# Patient Record
Sex: Female | Born: 1985 | Race: White | Hispanic: No | Marital: Married | Smoking: Current some day smoker
Health system: Southern US, Community
[De-identification: ages and names within clinical notes are randomized; demographics above are authoritative.]

## PROBLEM LIST (undated history)

## (undated) DIAGNOSIS — F4000 Agoraphobia, unspecified: Secondary | ICD-10-CM

## (undated) DIAGNOSIS — F419 Anxiety disorder, unspecified: Secondary | ICD-10-CM

## (undated) DIAGNOSIS — F4310 Post-traumatic stress disorder, unspecified: Secondary | ICD-10-CM

---

## 2017-09-08 ENCOUNTER — Emergency Department (HOSPITAL_COMMUNITY): Payer: BLUE CROSS/BLUE SHIELD

## 2017-09-08 ENCOUNTER — Inpatient Hospital Stay (HOSPITAL_COMMUNITY)
Admission: EM | Admit: 2017-09-08 | Discharge: 2017-09-15 | DRG: 101 | Disposition: A | Discharge status: Home / Self Care | Payer: BLUE CROSS/BLUE SHIELD | Attending: Internal Medicine | Admitting: Internal Medicine

## 2017-09-08 ENCOUNTER — Other Ambulatory Visit: Payer: Self-pay

## 2017-09-08 ENCOUNTER — Encounter (HOSPITAL_COMMUNITY): Payer: Self-pay | Admitting: Emergency Medicine

## 2017-09-08 DIAGNOSIS — F10239 Alcohol dependence with withdrawal, unspecified: Secondary | ICD-10-CM | POA: Diagnosis not present

## 2017-09-08 DIAGNOSIS — D7589 Other specified diseases of blood and blood-forming organs: Secondary | ICD-10-CM | POA: Diagnosis present

## 2017-09-08 DIAGNOSIS — R569 Unspecified convulsions: Secondary | ICD-10-CM | POA: Diagnosis not present

## 2017-09-08 DIAGNOSIS — F329 Major depressive disorder, single episode, unspecified: Secondary | ICD-10-CM | POA: Diagnosis present

## 2017-09-08 DIAGNOSIS — F13239 Sedative, hypnotic or anxiolytic dependence with withdrawal, unspecified: Secondary | ICD-10-CM | POA: Diagnosis present

## 2017-09-08 DIAGNOSIS — S12601D Unspecified nondisplaced fracture of seventh cervical vertebra, subsequent encounter for fracture with routine healing: Secondary | ICD-10-CM | POA: Diagnosis not present

## 2017-09-08 DIAGNOSIS — F10231 Alcohol dependence with withdrawal delirium: Secondary | ICD-10-CM | POA: Diagnosis present

## 2017-09-08 DIAGNOSIS — R45851 Suicidal ideations: Secondary | ICD-10-CM | POA: Diagnosis present

## 2017-09-08 DIAGNOSIS — F4310 Post-traumatic stress disorder, unspecified: Secondary | ICD-10-CM | POA: Diagnosis present

## 2017-09-08 DIAGNOSIS — S12600A Unspecified displaced fracture of seventh cervical vertebra, initial encounter for closed fracture: Secondary | ICD-10-CM

## 2017-09-08 DIAGNOSIS — R441 Visual hallucinations: Secondary | ICD-10-CM | POA: Diagnosis present

## 2017-09-08 DIAGNOSIS — F17200 Nicotine dependence, unspecified, uncomplicated: Secondary | ICD-10-CM | POA: Diagnosis present

## 2017-09-08 DIAGNOSIS — F445 Conversion disorder with seizures or convulsions: Secondary | ICD-10-CM | POA: Diagnosis present

## 2017-09-08 DIAGNOSIS — R718 Other abnormality of red blood cells: Secondary | ICD-10-CM | POA: Diagnosis present

## 2017-09-08 DIAGNOSIS — F1010 Alcohol abuse, uncomplicated: Secondary | ICD-10-CM | POA: Diagnosis not present

## 2017-09-08 DIAGNOSIS — G4089 Other seizures: Secondary | ICD-10-CM | POA: Diagnosis present

## 2017-09-08 DIAGNOSIS — F515 Nightmare disorder: Secondary | ICD-10-CM | POA: Diagnosis not present

## 2017-09-08 DIAGNOSIS — G40909 Epilepsy, unspecified, not intractable, without status epilepticus: Secondary | ICD-10-CM | POA: Diagnosis present

## 2017-09-08 DIAGNOSIS — S129XXA Fracture of neck, unspecified, initial encounter: Secondary | ICD-10-CM

## 2017-09-08 HISTORY — DX: Anxiety disorder, unspecified: F41.9

## 2017-09-08 HISTORY — DX: Agoraphobia, unspecified: F40.00

## 2017-09-08 HISTORY — DX: Post-traumatic stress disorder, unspecified: F43.10

## 2017-09-08 LAB — CBC WITH DIFFERENTIAL/PLATELET
BASOS ABS: 0 10*3/uL (ref 0.0–0.1)
BASOS PCT: 0 %
Eosinophils Absolute: 0.1 10*3/uL (ref 0.0–0.7)
Eosinophils Relative: 2 %
HEMATOCRIT: 38.8 % (ref 36.0–46.0)
Hemoglobin: 12.7 g/dL (ref 12.0–15.0)
LYMPHS ABS: 1.2 10*3/uL (ref 0.7–4.0)
LYMPHS PCT: 16 %
MCH: 32.5 pg (ref 26.0–34.0)
MCHC: 32.7 g/dL (ref 30.0–36.0)
MCV: 99.2 fL (ref 78.0–100.0)
MONOS PCT: 4 %
Monocytes Absolute: 0.3 10*3/uL (ref 0.1–1.0)
Neutro Abs: 5.6 10*3/uL (ref 1.7–7.7)
Neutrophils Relative %: 78 %
Platelets: 192 10*3/uL (ref 150–400)
RBC: 3.91 MIL/uL (ref 3.87–5.11)
RDW: 12.8 % (ref 11.5–15.5)
WBC: 7.2 10*3/uL (ref 4.0–10.5)

## 2017-09-08 LAB — COMPREHENSIVE METABOLIC PANEL
ALBUMIN: 3.4 g/dL — AB (ref 3.5–5.0)
ALK PHOS: 48 U/L (ref 38–126)
ALT: 39 U/L (ref 14–54)
AST: 35 U/L (ref 15–41)
Anion gap: 6 (ref 5–15)
BILIRUBIN TOTAL: 0.4 mg/dL (ref 0.3–1.2)
BUN: 16 mg/dL (ref 6–20)
CALCIUM: 8.6 mg/dL — AB (ref 8.9–10.3)
CO2: 26 mmol/L (ref 22–32)
Chloride: 106 mmol/L (ref 101–111)
Creatinine, Ser: 0.63 mg/dL (ref 0.44–1.00)
GFR calc Af Amer: >60 mL/min (ref >60)
GFR calc non Af Amer: >60 mL/min (ref >60)
GLUCOSE: 90 mg/dL (ref 65–99)
Potassium: 3.9 mmol/L (ref 3.5–5.1)
Sodium: 138 mmol/L (ref 135–145)
TOTAL PROTEIN: 6.4 g/dL — AB (ref 6.5–8.1)

## 2017-09-08 LAB — I-STAT BETA HCG BLOOD, ED (MC, WL, AP ONLY): I-stat hCG, quantitative: <5 m[IU]/mL (ref <5)

## 2017-09-08 LAB — CBG MONITORING, ED: Glucose-Capillary: 108 mg/dL — ABNORMAL HIGH (ref 65–99)

## 2017-09-08 LAB — ETHANOL: Alcohol, Ethyl (B): <10 mg/dL (ref <10)

## 2017-09-08 MED ORDER — TRAMADOL HCL 50 MG PO TABS: 50.0000 mg | ORAL_TABLET | Freq: Four times a day (QID) | ORAL | Status: DC | PRN

## 2017-09-08 MED ORDER — LORAZEPAM 1 MG PO TABS: 1.0000 mg | ORAL_TABLET | Freq: Four times a day (QID) | ORAL | Status: DC | PRN

## 2017-09-08 MED ORDER — CARBAMAZEPINE 200 MG PO TABS
200.0000 mg | ORAL_TABLET | Freq: Two times a day (BID) | ORAL | Status: DC
  Administered 2017-09-08 – 2017-09-11 (×7): 200 mg via ORAL
  Filled 2017-09-08 (×8): qty 1

## 2017-09-08 MED ORDER — CLONAZEPAM 1 MG PO TABS
1.0000 mg | ORAL_TABLET | Freq: Two times a day (BID) | ORAL | Status: DC
  Administered 2017-09-08 – 2017-09-09 (×2): 1 mg via ORAL
  Filled 2017-09-08 (×2): qty 1

## 2017-09-08 MED ORDER — PAROXETINE HCL 20 MG PO TABS
20.0000 mg | ORAL_TABLET | Freq: Every day | ORAL | Status: DC
Start: 2017-09-09 — End: 2017-09-12
  Administered 2017-09-09 – 2017-09-11 (×3): 20 mg via ORAL
  Filled 2017-09-08 (×3): qty 1

## 2017-09-08 MED ORDER — ACETAMINOPHEN 325 MG PO TABS: 650.0000 mg | ORAL_TABLET | Freq: Four times a day (QID) | ORAL | Status: DC | PRN

## 2017-09-08 MED ORDER — IBUPROFEN 800 MG PO TABS
800.0000 mg | ORAL_TABLET | Freq: Once | ORAL | Status: AC
  Administered 2017-09-08: 800 mg via ORAL
  Filled 2017-09-08: qty 1

## 2017-09-08 MED ORDER — LORAZEPAM 2 MG/ML IJ SOLN
1.0000 mg | Freq: Four times a day (QID) | INTRAMUSCULAR | Status: DC | PRN
  Administered 2017-09-08 – 2017-09-09 (×3): 1 mg via INTRAVENOUS
  Filled 2017-09-08 (×3): qty 1

## 2017-09-08 MED ORDER — THIAMINE HCL 100 MG/ML IJ SOLN
Freq: Once | INTRAVENOUS | Status: AC
  Administered 2017-09-08: 17:00:00 via INTRAVENOUS
  Filled 2017-09-08 (×2): qty 1000

## 2017-09-08 MED ORDER — ACETAMINOPHEN 650 MG RE SUPP: 650.0000 mg | Freq: Four times a day (QID) | RECTAL | Status: DC | PRN

## 2017-09-08 MED ORDER — HYDROCODONE-ACETAMINOPHEN 5-325 MG PO TABS
1.0000 | ORAL_TABLET | Freq: Four times a day (QID) | ORAL | Status: DC | PRN
  Administered 2017-09-08 – 2017-09-09 (×4): 1 via ORAL
  Filled 2017-09-08 (×4): qty 1

## 2017-09-08 MED ORDER — ZOLPIDEM TARTRATE 5 MG PO TABS
5.0000 mg | ORAL_TABLET | Freq: Once | ORAL | Status: AC
  Administered 2017-09-09: 5 mg via ORAL
  Filled 2017-09-08: qty 1

## 2017-09-08 MED ORDER — IBUPROFEN 800 MG PO TABS
800.0000 mg | ORAL_TABLET | Freq: Four times a day (QID) | ORAL | Status: DC | PRN
  Administered 2017-09-09: 800 mg via ORAL
  Filled 2017-09-08: qty 1

## 2017-09-08 MED ORDER — ONDANSETRON HCL 4 MG PO TABS: 4.0000 mg | ORAL_TABLET | Freq: Four times a day (QID) | ORAL | Status: DC | PRN

## 2017-09-08 MED ORDER — MORPHINE SULFATE (PF) 2 MG/ML IV SOLN
2.0000 mg | Freq: Once | INTRAVENOUS | Status: AC
  Administered 2017-09-08: 2 mg via INTRAVENOUS
  Filled 2017-09-08: qty 1

## 2017-09-08 MED ORDER — ENOXAPARIN SODIUM 40 MG/0.4ML ~~LOC~~ SOLN
40.0000 mg | SUBCUTANEOUS | Status: DC
  Administered 2017-09-09 – 2017-09-13 (×5): 40 mg via SUBCUTANEOUS
  Filled 2017-09-08 (×7): qty 0.4

## 2017-09-08 MED ORDER — ONDANSETRON HCL 4 MG/2ML IJ SOLN
4.0000 mg | Freq: Four times a day (QID) | INTRAMUSCULAR | Status: DC | PRN
  Administered 2017-09-11 – 2017-09-13 (×4): 4 mg via INTRAVENOUS
  Filled 2017-09-08 (×5): qty 2

## 2017-09-08 NOTE — ED Notes (Signed)
URINE SAMPLE AT BEDSIDE IF NEEDED  

## 2017-09-08 NOTE — H&P (Signed)
History and Physical    Claire Martinez ZHY:[id number] DOB: 1985-07-15 DOA: 09/08/2017  PCP: System, Pcp Not In  Patient coming from: [facility]   Chief Complaint: Seizures at [facility]   HPI: Claire Martinez is a 32 y.o. female with medical history significant of PTSD, alcoholism who states she has been drinking 12 pack of beer daily for the past year. She stopped drinking 3 days ago.  She was found to have tonic-clonic seizures at [facility].  She states that she has had withdrawal seizures in the past.  Currently, she admits to overall body aches, feeling foggy.  She denies any recent illnesses.  Denies any fever, chills, shortness of breath, cough, nausea.  She did have some vomiting yesterday as well as diarrhea yesterday and today.  ED Course: Labs obtained which were overall unremarkable.  CT head and cervical spine revealed no acute brain injury.  Subtle fracture/fragmentation of right superior facet of C7 which may be acute or chronic.  EDP spoke with neurosurgery for recommendations.  Aspen collar ordered.  Review of Systems: As per HPI otherwise 10 point review of systems negative.   Past Medical History:  Diagnosis Date  . Agoraphobia   . Anxiety   . PTSD (post-traumatic stress disorder)     History reviewed. No pertinent surgical history.   reports that she has been smoking.  She has never used smokeless tobacco. She reports that she drinks alcohol. She reports that she does not use drugs.  No Known Allergies  History reviewed. No pertinent family history.   Prior to Admission medications   Medication Sig Start Date End Date Taking? Authorizing Provider  amphetamine-dextroamphetamine (ADDERALL) 20 MG tablet Take 20 mg by mouth daily. 08/21/17  Yes [provider]  carbamazepine (TEGRETOL) 200 MG tablet Take 200 mg by mouth 2 (two) times daily.   Yes [provider]  chlordiazePOXIDE (LIBRIUM) 10 MG capsule Take 20 mg by mouth 4 (four)  times daily.   Yes [provider]  clonazePAM (KLONOPIN) 1 MG tablet Take 1 mg by mouth 2 (two) times daily. 08/28/17  Yes [provider]  PARoxetine (PAXIL) 20 MG tablet Take 20 mg by mouth daily. 07/29/17  Yes [provider]  traZODone (DESYREL) 50 MG tablet Take 50 mg by mouth at bedtime as needed for sleep.  06/27/17  Yes [provider]    Physical Exam: Vitals:   09/08/17 1100 09/08/17 1200 09/08/17 1300 09/08/17 1500  BP: 127/82 126/83 123/79 121/77  Pulse: 89 72 77 74  Resp: 16 14 (!) 22 18  Temp:    97.6 F (36.4 C)  TempSrc:    Oral  SpO2: 96% 99% 97% 97%  Weight:      Height:        Constitutional: NAD, calm, comfortable Eyes: PERRL, lids and conjunctivae normal ENMT: Mucous membranes are moist. Posterior pharynx clear of any exudate or lesions.Normal dentition.  Neck: normal, supple, no masses, no thyromegaly Respiratory: clear to auscultation bilaterally, no wheezing, no crackles. Normal respiratory effort. No accessory muscle use.  Cardiovascular: Regular rate and rhythm, no murmurs / rubs / gallops. No extremity edema Abdomen: no tenderness, no masses palpated. No hepatosplenomegaly. Bowel sounds positive.  Musculoskeletal: no clubbing / cyanosis. No joint deformity upper and lower extremities. Good ROM, no contractures. Normal muscle tone.  Skin: no rashes, lesions, ulcers. No induration Neurologic: CN 2-12 grossly intact. Strength 5/5 in all 4.  Nonfocal.  Speech clear. Psychiatric: Normal judgment and insight.  Alert and oriented x 3. Normal mood.   Labs on Admission: I have personally reviewed following labs and imaging studies  CBC: Recent Labs  Lab 09/08/17 1151  WBC 7.2  NEUTROABS 5.6  HGB 12.7  HCT 38.8  MCV 99.2  PLT 192   Basic Metabolic Panel: Recent Labs  Lab 09/08/17 1151  NA 138  K 3.9  CL 106  CO2 26  GLUCOSE 90  BUN 16  CREATININE 0.63  CALCIUM 8.6*   GFR: Estimated Creatinine Clearance: 94.4  mL/min (by C-G formula based on SCr of 0.63 mg/dL). Liver Function Tests: Recent Labs  Lab 09/08/17 1151  AST 35  ALT 39  ALKPHOS 48  BILITOT 0.4  PROT 6.4*  ALBUMIN 3.4*   No results for input(s): LIPASE, AMYLASE in the last 168 hours. No results for input(s): AMMONIA in the last 168 hours. Coagulation Profile: No results for input(s): INR, PROTIME in the last 168 hours. Cardiac Enzymes: No results for input(s): CKTOTAL, CKMB, CKMBINDEX, TROPONINI in the last 168 hours. BNP (last 3 results) No results for input(s): PROBNP in the last 8760 hours. HbA1C: No results for input(s): HGBA1C in the last 72 hours. CBG: Recent Labs  Lab 09/08/17 1110  GLUCAP 108*   Lipid Profile: No results for input(s): CHOL, HDL, LDLCALC, TRIG, CHOLHDL, LDLDIRECT in the last 72 hours. Thyroid Function Tests: No results for input(s): TSH, T4TOTAL, FREET4, T3FREE, THYROIDAB in the last 72 hours. Anemia Panel: No results for input(s): VITAMINB12, FOLATE, FERRITIN, TIBC, IRON, RETICCTPCT in the last 72 hours. Urine analysis: No results found for: COLORURINE, APPEARANCEUR, LABSPEC, PHURINE, GLUCOSEU, HGBUR, BILIRUBINUR, KETONESUR, PROTEINUR, UROBILINOGEN, NITRITE, LEUKOCYTESUR Sepsis Labs: !!!!!!!!!!!!!!!!!!!!!!!!!!!!!!!!!!!!!!!!!!!! @LABRCNTIP (procalcitonin:4,lacticidven:4) )No results found for this or any previous visit (from the past 240 hour(s)).   Radiological Exams on Admission: Ct Head Wo Contrast  Result Date: 09/08/2017 CLINICAL DATA:  Seizures. EXAM: CT HEAD WITHOUT CONTRAST CT CERVICAL SPINE WITHOUT CONTRAST TECHNIQUE: Multidetector CT imaging of the head and cervical spine was performed following the standard protocol without intravenous contrast. Multiplanar CT image reconstructions of the cervical spine were also generated. COMPARISON:  None. FINDINGS: CT HEAD FINDINGS Brain: No evidence of acute infarction, hemorrhage, hydrocephalus, extra-axial collection or mass lesion/mass effect.  Vascular: No hyperdense vessel or unexpected calcification. Skull: Normal. Negative for fracture or focal lesion. Sinuses/Orbits: Hypoplastic frontal sinuses. Sinuses are otherwise clear. Orbits are normal. Other: None. CT CERVICAL SPINE FINDINGS Alignment: Reversal of the normal cervical lordosis. No subluxation. Skull base and vertebrae: Vertebral body heights are normal. Atlantoaxial articulation is normal. No significant neural foraminal narrowing. Subtle fragmentation involving the right superior facet of C7 which may be acute or chronic. No fragments within the spinal canal. No other fractures identified. Soft tissues and spinal canal: No prevertebral fluid or swelling. No visible canal hematoma. Disc levels:  Normal. Upper chest: Negative. Other: None. IMPRESSION: No acute brain injury. Subtle fracture/fragmentation involving the right superior facet of C7 which may be acute or chronic. No additional fractures identified. Critical Value/emergent results were called by telephone at the time of interpretation on 09/08/2017 at 1:32 pm to Dr. Bethann Berkshire , who verbally acknowledged these results. Electronically Signed   By: Elberta Fortis M.D.   On: 09/08/2017 13:33   Ct Cervical Spine Wo Contrast  Result Date: 09/08/2017 CLINICAL DATA:  Seizures. EXAM: CT HEAD WITHOUT CONTRAST CT CERVICAL SPINE WITHOUT CONTRAST TECHNIQUE: Multidetector CT imaging of the head and cervical spine was performed following the standard protocol without intravenous contrast. Multiplanar CT  image reconstructions of the cervical spine were also generated. COMPARISON:  None. FINDINGS: CT HEAD FINDINGS Brain: No evidence of acute infarction, hemorrhage, hydrocephalus, extra-axial collection or mass lesion/mass effect. Vascular: No hyperdense vessel or unexpected calcification. Skull: Normal. Negative for fracture or focal lesion. Sinuses/Orbits: Hypoplastic frontal sinuses. Sinuses are otherwise clear. Orbits are normal. Other: None.  CT CERVICAL SPINE FINDINGS Alignment: Reversal of the normal cervical lordosis. No subluxation. Skull base and vertebrae: Vertebral body heights are normal. Atlantoaxial articulation is normal. No significant neural foraminal narrowing. Subtle fragmentation involving the right superior facet of C7 which may be acute or chronic. No fragments within the spinal canal. No other fractures identified. Soft tissues and spinal canal: No prevertebral fluid or swelling. No visible canal hematoma. Disc levels:  Normal. Upper chest: Negative. Other: None. IMPRESSION: No acute brain injury. Subtle fracture/fragmentation involving the right superior facet of C7 which may be acute or chronic. No additional fractures identified. Critical Value/emergent results were called by telephone at the time of interpretation on 09/08/2017 at 1:32 pm to Dr. Bethann Berkshire , who verbally acknowledged these results. Electronically Signed   By: Elberta Fortis M.D.   On: 09/08/2017 13:33   Assessment/Plan Principal Problem:   Seizure (HCC) Active Problems:   Alcohol abuse   PTSD (post-traumatic stress disorder)   C7 cervical fracture (HCC)   Seizures -Likely alcohol withdrawal seizure.  Last-alcohol use was 3 days ago. -CIWA -Seizure precautions   Subtle acute vs chronic C7 fracture/fragmentation -EDP spoke with neurosurgery Dr. Yetta Barre who recommend Aspen collar for 2 weeks and follow up in the office in 2 weeks. Not formally consulted.   PTSD/Depression -Continue home meds    DVT prophylaxis: Lovenox Code Status: Full  Family Communication: No family at bedside Disposition Plan: Pending improvement, back to [facility] [employer] called: Neurosurgery called by EDP  Admission status: Inpatient   * I certify that at the point of admission it is my clinical judgment that the patient will require inpatient hospital care spanning beyond 2 midnights from the point of admission due to high intensity of service, high risk for  further deterioration and high frequency of surveillance required.*   Noralee Stain, DO [facility] [email or web address] Password [facility] 09/08/2017, 3:07 PM

## 2017-09-08 NOTE — ED Triage Notes (Signed)
Pt BIB [facility] from [facility] for 2 tonic clonic seizures at facility lasting 2min then 5 min postictal  and one with EMS. Pt given 20 mg valium at rehab center and 2.5 mg midazolam by EMS. Pt has been at [facility] since Friday. Pt has been getting  Librium at rehab. Pt now a/o x4.

## 2017-09-08 NOTE — ED Notes (Signed)
ED TO INPATIENT HANDOFF REPORT  Name/Age/Gender Claire Martinez 32 y.o. female  Code Status   Home/SNF/Other Rehab  Chief Complaint Seizures  Level of Care/Admitting Diagnosis ED Disposition    ED Disposition Condition [facility] Area: [place] [[id number]]  Level of Care: Telemetry [5]  Admit to tele based on following criteria: Other see comments  Comments: seizure  Diagnosis: Seizure [facility]) [[id number]]  Admitting Physician: Dessa Phi [id number]  Attending Physician: Dessa Phi [phone]  Estimated length of stay: past midnight tomorrow  Certification:: I certify this patient will need inpatient services for at least 2 midnights  PT Class (Do Not Modify): Inpatient [101]  PT Acc Code (Do Not Modify): Private [1]       Medical History Past Medical History:  Diagnosis Date  . Agoraphobia   . Anxiety   . PTSD (post-traumatic stress disorder)     Allergies No Known Allergies  IV Location/Drains/Wounds Patient Lines/Drains/Airways Status   Active Line/Drains/Airways    Name:   Placement date:   Placement time:   Site:   Days:   Peripheral IV 09/08/17 Left Forearm   09/08/17    1036    Forearm   less than 1          Labs/Imaging Results for orders placed or performed during the hospital encounter of 09/08/17 (from the past 48 hour(s))  CBG monitoring, ED     Status: Abnormal   Collection Time: 09/08/17 11:10 AM  Result Value Ref Range   Glucose-Capillary 108 (H) 65 - 99 mg/dL  I-Stat beta hCG blood, ED     Status: None   Collection Time: 09/08/17 11:17 AM  Result Value Ref Range   I-stat hCG, quantitative <5.0 <5 mIU/mL   Comment 3            Comment:   GEST. AGE      CONC.  (mIU/mL)   <=1 WEEK        5 - 50     2 WEEKS       50 - 500     3 WEEKS       100 - 10,000     4 WEEKS     1,000 - 30,000        FEMALE AND NON-PREGNANT FEMALE:     LESS THAN 5 mIU/mL   CBC with Differential/Platelet     Status: None    Collection Time: 09/08/17 11:51 AM  Result Value Ref Range   WBC 7.2 4.0 - 10.5 K/uL   RBC 3.91 3.87 - 5.11 MIL/uL   Hemoglobin 12.7 12.0 - 15.0 g/dL   HCT 38.8 36.0 - 46.0 %   MCV 99.2 78.0 - 100.0 fL   MCH 32.5 26.0 - 34.0 pg   MCHC 32.7 30.0 - 36.0 g/dL   RDW 12.8 11.5 - 15.5 %   Platelets 192 150 - 400 K/uL   Neutrophils Relative % 78 %   Neutro Abs 5.6 1.7 - 7.7 K/uL   Lymphocytes Relative 16 %   Lymphs Abs 1.2 0.7 - 4.0 K/uL   Monocytes Relative 4 %   Monocytes Absolute 0.3 0.1 - 1.0 K/uL   Eosinophils Relative 2 %   Eosinophils Absolute 0.1 0.0 - 0.7 K/uL   Basophils Relative 0 %   Basophils Absolute 0.0 0.0 - 0.1 K/uL    Comment: Performed at [facility], [place] [address], [place], [place] [address]  Comprehensive metabolic panel  Status: Abnormal   Collection Time: 09/08/17 11:51 AM  Result Value Ref Range   Sodium 138 135 - 145 mmol/L   Potassium 3.9 3.5 - 5.1 mmol/L   Chloride 106 101 - 111 mmol/L   CO2 26 22 - 32 mmol/L   Glucose, Bld 90 65 - 99 mg/dL   BUN 16 6 - 20 mg/dL   Creatinine, Ser 0.63 0.44 - 1.00 mg/dL   Calcium 8.6 (L) 8.9 - 10.3 mg/dL   Total Protein 6.4 (L) 6.5 - 8.1 g/dL   Albumin 3.4 (L) 3.5 - 5.0 g/dL   AST 35 15 - 41 U/L   ALT 39 14 - 54 U/L   Alkaline Phosphatase 48 38 - 126 U/L   Total Bilirubin 0.4 0.3 - 1.2 mg/dL   GFR calc non Af Amer >60 >60 mL/min   GFR calc Af Amer >60 >60 mL/min    Comment: (NOTE) The eGFR has been calculated using the CKD EPI equation. This calculation has not been validated in all clinical situations. eGFR's persistently <60 mL/min signify possible Chronic Kidney Disease.    Anion gap 6 5 - 15    Comment: Performed at [facility], [place] [address] [place], [place] [address]  Ethanol     Status: None   Collection Time: 09/08/17 11:51 AM  Result Value Ref Range   Alcohol, Ethyl (B) <10 <10 mg/dL    Comment: (NOTE) Lowest detectable limit for serum alcohol is 10  mg/dL. For medical purposes only. Performed at [facility], [place] [address] [place], [place] [address]    Ct Head Wo Contrast  Result Date: 09/08/2017 CLINICAL DATA:  Seizures. EXAM: CT HEAD WITHOUT CONTRAST CT CERVICAL SPINE WITHOUT CONTRAST TECHNIQUE: Multidetector CT imaging of the head and cervical spine was performed following the standard protocol without intravenous contrast. Multiplanar CT image reconstructions of the cervical spine were also generated. COMPARISON:  None. FINDINGS: CT HEAD FINDINGS Brain: No evidence of acute infarction, hemorrhage, hydrocephalus, extra-axial collection or mass lesion/mass effect. Vascular: No hyperdense vessel or unexpected calcification. Skull: Normal. Negative for fracture or focal lesion. Sinuses/Orbits: Hypoplastic frontal sinuses. Sinuses are otherwise clear. Orbits are normal. Other: None. CT CERVICAL SPINE FINDINGS Alignment: Reversal of the normal cervical lordosis. No subluxation. Skull base and vertebrae: Vertebral body heights are normal. Atlantoaxial articulation is normal. No significant neural foraminal narrowing. Subtle fragmentation involving the right superior facet of C7 which may be acute or chronic. No fragments within the spinal canal. No other fractures identified. Soft tissues and spinal canal: No prevertebral fluid or swelling. No visible canal hematoma. Disc levels:  Normal. Upper chest: Negative. Other: None. IMPRESSION: No acute brain injury. Subtle fracture/fragmentation involving the right superior facet of C7 which may be acute or chronic. No additional fractures identified. Critical Value/emergent results were called by telephone at the time of interpretation on 09/08/2017 at 1:32 pm to Dr. Milton Ferguson , who verbally acknowledged these results. Electronically Signed   By: Marin Olp M.D.   On: 09/08/2017 13:33   Ct Cervical Spine Wo Contrast  Result Date: 09/08/2017 CLINICAL DATA:  Seizures. EXAM: CT HEAD  WITHOUT CONTRAST CT CERVICAL SPINE WITHOUT CONTRAST TECHNIQUE: Multidetector CT imaging of the head and cervical spine was performed following the standard protocol without intravenous contrast. Multiplanar CT image reconstructions of the cervical spine were also generated. COMPARISON:  None. FINDINGS: CT HEAD FINDINGS Brain: No evidence of acute infarction, hemorrhage, hydrocephalus, extra-axial collection or mass lesion/mass effect. Vascular: No  hyperdense vessel or unexpected calcification. Skull: Normal. Negative for fracture or focal lesion. Sinuses/Orbits: Hypoplastic frontal sinuses. Sinuses are otherwise clear. Orbits are normal. Other: None. CT CERVICAL SPINE FINDINGS Alignment: Reversal of the normal cervical lordosis. No subluxation. Skull base and vertebrae: Vertebral body heights are normal. Atlantoaxial articulation is normal. No significant neural foraminal narrowing. Subtle fragmentation involving the right superior facet of C7 which may be acute or chronic. No fragments within the spinal canal. No other fractures identified. Soft tissues and spinal canal: No prevertebral fluid or swelling. No visible canal hematoma. Disc levels:  Normal. Upper chest: Negative. Other: None. IMPRESSION: No acute brain injury. Subtle fracture/fragmentation involving the right superior facet of C7 which may be acute or chronic. No additional fractures identified. Critical Value/emergent results were called by telephone at the time of interpretation on 09/08/2017 at 1:32 pm to Dr. Milton Ferguson , who verbally acknowledged these results. Electronically Signed   By: Marin Olp M.D.   On: 09/08/2017 13:33    Pending Labs [employer] (From admission, onward)   Start     Ordered   Signed and Held  HIV antibody (Routine Testing)  Once,   R     Signed and Held   Signed and Held  CBC  Tomorrow morning,   R     Signed and Held   Signed and Held  Basic metabolic panel  Tomorrow morning,   R     Signed and Held       Vitals/Pain Today's Vitals   09/08/17 1500 09/08/17 1515 09/08/17 1542 09/08/17 1600  BP: 121/77 123/76  121/80  Pulse: 83 79  73  Resp: 17 19  17   Temp: 97.6 F (36.4 C)     TempSrc: Oral     SpO2: 96% 95%  95%  Weight:      Height:      PainSc:   8      Isolation Precautions No active isolations  Medications Medications  morphine 2 MG/ML injection 2 mg (2 mg Intravenous Given 09/08/17 1258)  ibuprofen (ADVIL,MOTRIN) tablet 800 mg (800 mg Oral Given 09/08/17 1541)    Mobility walks with person assist
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
## 2017-09-09 ENCOUNTER — Inpatient Hospital Stay (HOSPITAL_COMMUNITY): Payer: BLUE CROSS/BLUE SHIELD

## 2017-09-09 LAB — BASIC METABOLIC PANEL
Anion gap: 2 — ABNORMAL LOW (ref 5–15)
BUN: 12 mg/dL (ref 6–20)
CHLORIDE: 106 mmol/L (ref 101–111)
CO2: 31 mmol/L (ref 22–32)
CREATININE: 0.67 mg/dL (ref 0.44–1.00)
Calcium: 8.1 mg/dL — ABNORMAL LOW (ref 8.9–10.3)
GFR calc non Af Amer: >60 mL/min (ref >60)
GLUCOSE: 100 mg/dL — AB (ref 65–99)
Potassium: 3.7 mmol/L (ref 3.5–5.1)
Sodium: 139 mmol/L (ref 135–145)

## 2017-09-09 LAB — CBC
HCT: 37.4 % (ref 36.0–46.0)
HEMOGLOBIN: 12.4 g/dL (ref 12.0–15.0)
MCH: 33.3 pg (ref 26.0–34.0)
MCHC: 33.2 g/dL (ref 30.0–36.0)
MCV: 100.5 fL — AB (ref 78.0–100.0)
Platelets: 176 10*3/uL (ref 150–400)
RBC: 3.72 MIL/uL — ABNORMAL LOW (ref 3.87–5.11)
RDW: 12.8 % (ref 11.5–15.5)
WBC: 6 10*3/uL (ref 4.0–10.5)

## 2017-09-09 LAB — VITAMIN B12: VITAMIN B 12: 315 pg/mL (ref 180–914)

## 2017-09-09 LAB — MRSA PCR SCREENING: MRSA by PCR: NEGATIVE

## 2017-09-09 LAB — HIV ANTIBODY (ROUTINE TESTING W REFLEX): HIV SCREEN 4TH GENERATION: NONREACTIVE

## 2017-09-09 LAB — CARBAMAZEPINE LEVEL, TOTAL: Carbamazepine Lvl: 7.7 ug/mL (ref 4.0–12.0)

## 2017-09-09 MED ORDER — LORAZEPAM 1 MG PO TABS
1.0000 mg | ORAL_TABLET | ORAL | Status: DC | PRN
  Administered 2017-09-09: 1 mg via ORAL
  Administered 2017-09-10: 2 mg via ORAL
  Filled 2017-09-09: qty 2
  Filled 2017-09-09: qty 1

## 2017-09-09 MED ORDER — DEXTROSE-NACL 5-0.9 % IV SOLN
INTRAVENOUS | Status: DC
  Administered 2017-09-09 – 2017-09-13 (×5): via INTRAVENOUS

## 2017-09-09 MED ORDER — ADULT MULTIVITAMIN W/MINERALS CH
1.0000 | ORAL_TABLET | Freq: Every day | ORAL | Status: DC
  Administered 2017-09-09: 1 via ORAL
  Filled 2017-09-09: qty 1

## 2017-09-09 MED ORDER — ZOLPIDEM TARTRATE 5 MG PO TABS
5.0000 mg | ORAL_TABLET | Freq: Every evening | ORAL | Status: DC | PRN
  Administered 2017-09-10 – 2017-09-12 (×2): 5 mg via ORAL
  Filled 2017-09-09 (×2): qty 1

## 2017-09-09 MED ORDER — THIAMINE HCL 100 MG/ML IJ SOLN: 100.0000 mg | Freq: Every day | INTRAMUSCULAR | Status: DC

## 2017-09-09 MED ORDER — LORAZEPAM 1 MG PO TABS: 1.0000 mg | ORAL_TABLET | Freq: Four times a day (QID) | ORAL | Status: DC | PRN

## 2017-09-09 MED ORDER — LORAZEPAM 2 MG/ML IJ SOLN: 1.0000 mg | INTRAMUSCULAR | Status: DC | PRN

## 2017-09-09 MED ORDER — LORAZEPAM 2 MG/ML IJ SOLN
INTRAMUSCULAR | Status: AC
  Filled 2017-09-09: qty 1

## 2017-09-09 MED ORDER — CYANOCOBALAMIN 1000 MCG/ML IJ SOLN
1000.0000 ug | Freq: Once | INTRAMUSCULAR | Status: AC
  Administered 2017-09-09: 1000 ug via SUBCUTANEOUS
  Filled 2017-09-09 (×3): qty 1

## 2017-09-09 MED ORDER — VITAMIN B-1 100 MG PO TABS
100.0000 mg | ORAL_TABLET | Freq: Every day | ORAL | Status: DC
  Administered 2017-09-09: 100 mg via ORAL
  Filled 2017-09-09: qty 1

## 2017-09-09 MED ORDER — LORAZEPAM 1 MG PO TABS: 0.0000 mg | ORAL_TABLET | Freq: Four times a day (QID) | ORAL | Status: DC

## 2017-09-09 MED ORDER — FOLIC ACID 5 MG/ML IJ SOLN
1.0000 mg | Freq: Every day | INTRAMUSCULAR | Status: DC
  Administered 2017-09-09: 1 mg via INTRAVENOUS
  Filled 2017-09-09 (×3): qty 0.2

## 2017-09-09 MED ORDER — FOLIC ACID 1 MG PO TABS
1.0000 mg | ORAL_TABLET | Freq: Every day | ORAL | Status: DC
  Administered 2017-09-09: 1 mg via ORAL
  Filled 2017-09-09: qty 1

## 2017-09-09 MED ORDER — CLONAZEPAM 1 MG PO TABS
1.0000 mg | ORAL_TABLET | Freq: Two times a day (BID) | ORAL | Status: DC
  Administered 2017-09-09 – 2017-09-11 (×5): 1 mg via ORAL
  Filled 2017-09-09 (×5): qty 1

## 2017-09-09 MED ORDER — THIAMINE HCL 100 MG/ML IJ SOLN
100.0000 mg | Freq: Every day | INTRAMUSCULAR | Status: DC
  Administered 2017-09-09: 100 mg via INTRAVENOUS
  Filled 2017-09-09: qty 2

## 2017-09-09 MED ORDER — LORAZEPAM 1 MG PO TABS: 0.0000 mg | ORAL_TABLET | Freq: Two times a day (BID) | ORAL | Status: DC

## 2017-09-09 MED ORDER — VITAMIN B-1 100 MG PO TABS: 100.0000 mg | ORAL_TABLET | Freq: Every day | ORAL | Status: DC

## 2017-09-09 MED ORDER — LORAZEPAM 2 MG/ML IJ SOLN
1.0000 mg | INTRAMUSCULAR | Status: DC | PRN
  Administered 2017-09-09: 2 mg via INTRAVENOUS

## 2017-09-09 MED ORDER — LORAZEPAM 2 MG/ML IJ SOLN
1.0000 mg | Freq: Four times a day (QID) | INTRAMUSCULAR | Status: DC | PRN
  Administered 2017-09-09: 1 mg via INTRAVENOUS
  Filled 2017-09-09: qty 1

## 2017-09-09 MED ORDER — FOLIC ACID 5 MG/ML IJ SOLN
1.0000 mg | Freq: Every day | INTRAMUSCULAR | Status: DC
  Filled 2017-09-09: qty 0.2

## 2017-09-09 MED ORDER — FOLIC ACID 1 MG PO TABS: 1.0000 mg | ORAL_TABLET | Freq: Every day | ORAL | Status: DC

## 2017-09-09 MED ORDER — HYDROMORPHONE HCL 1 MG/ML IJ SOLN
0.5000 mg | INTRAMUSCULAR | Status: DC | PRN
  Administered 2017-09-09 – 2017-09-10 (×4): 0.5 mg via INTRAVENOUS
  Filled 2017-09-09 (×4): qty 1

## 2017-09-09 NOTE — Progress Notes (Signed)
Pt bed alarm activated.  Pt sitting up in bed moving towards exiting bed.  RN to room immediately.  Pt asked where she was, saying she keeps falling asleep and waking up an hour later confused at where she is.  Pt is easily re directable, reassured.  Asked for a snack and for dr to be notified that she wants ambien to help her sleep fully like night before.  Katie Shorr notified.

## 2017-09-09 NOTE — Progress Notes (Signed)
MRI team/RN discussed order for MRI with patient.  She said she must be "knocked out" because she becomes very anxious in any closed in areas and she cannot do it without her husband here.  He is two hours away at this time.  Katie Shorr paged and made aware.  Will hold off on MRI at this time.  MRI department said that any conscious [facility] for MRIs [facility].  Pt updated and grateful that the MRI will not be done tonight.  Will continue to monitor closely
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
## 2017-09-09 NOTE — Progress Notes (Signed)
Pt called out and reported that she is very frightened and is seeing "black men" in her room.  Pt reassured.  CIWA ativan given and evening meds.  Pt calmed down after reassurance and listening to calm music.  Pt states she is alright and will try to rest.
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
## 2017-09-10 ENCOUNTER — Inpatient Hospital Stay (HOSPITAL_COMMUNITY)
Admit: 2017-09-10 | Discharge: 2017-09-10 | Disposition: A | Discharge status: Home / Self Care | Payer: BLUE CROSS/BLUE SHIELD | Attending: Internal Medicine | Admitting: Internal Medicine

## 2017-09-10 DIAGNOSIS — F515 Nightmare disorder: Secondary | ICD-10-CM

## 2017-09-10 DIAGNOSIS — R569 Unspecified convulsions: Secondary | ICD-10-CM

## 2017-09-10 LAB — BASIC METABOLIC PANEL
Anion gap: 5 (ref 5–15)
BUN: 12 mg/dL (ref 6–20)
CALCIUM: 8.2 mg/dL — AB (ref 8.9–10.3)
CO2: 30 mmol/L (ref 22–32)
Chloride: 104 mmol/L (ref 98–111)
Creatinine, Ser: 0.68 mg/dL (ref 0.44–1.00)
GFR calc Af Amer: >60 mL/min (ref >60)
Glucose, Bld: 89 mg/dL (ref 70–99)
Potassium: 3.8 mmol/L (ref 3.5–5.1)
Sodium: 139 mmol/L (ref 135–145)

## 2017-09-10 LAB — CBC WITH DIFFERENTIAL/PLATELET
Basophils Absolute: 0 10*3/uL (ref 0.0–0.1)
Basophils Relative: 1 %
EOS PCT: 3 %
Eosinophils Absolute: 0.2 10*3/uL (ref 0.0–0.7)
HCT: 38.6 % (ref 36.0–46.0)
Hemoglobin: 13 g/dL (ref 12.0–15.0)
LYMPHS ABS: 2 10*3/uL (ref 0.7–4.0)
LYMPHS PCT: 31 %
MCH: 33.3 pg (ref 26.0–34.0)
MCHC: 33.7 g/dL (ref 30.0–36.0)
MCV: 99 fL (ref 78.0–100.0)
MONOS PCT: 5 %
Monocytes Absolute: 0.3 10*3/uL (ref 0.1–1.0)
Neutro Abs: 3.7 10*3/uL (ref 1.7–7.7)
Neutrophils Relative %: 60 %
PLATELETS: 202 10*3/uL (ref 150–400)
RBC: 3.9 MIL/uL (ref 3.87–5.11)
RDW: 12.6 % (ref 11.5–15.5)
WBC: 6.2 10*3/uL (ref 4.0–10.5)

## 2017-09-10 LAB — LACTIC ACID, PLASMA: Lactic Acid, Venous: 2.1 mmol/L (ref 0.5–1.9)

## 2017-09-10 LAB — HEPATIC FUNCTION PANEL
ALT: 38 U/L (ref 0–44)
AST: 31 U/L (ref 15–41)
Albumin: 3.5 g/dL (ref 3.5–5.0)
Alkaline Phosphatase: 49 U/L (ref 38–126)
Total Bilirubin: 0.3 mg/dL (ref 0.3–1.2)
Total Protein: 6.3 g/dL — ABNORMAL LOW (ref 6.5–8.1)

## 2017-09-10 LAB — PHOSPHORUS: Phosphorus: 3.6 mg/dL (ref 2.5–4.6)

## 2017-09-10 LAB — MAGNESIUM: Magnesium: 1.8 mg/dL (ref 1.7–2.4)

## 2017-09-10 MED ORDER — MORPHINE SULFATE (PF) 2 MG/ML IV SOLN: 2.0000 mg | INTRAVENOUS | Status: DC | PRN

## 2017-09-10 MED ORDER — LORAZEPAM 2 MG/ML IJ SOLN
2.0000 mg | INTRAMUSCULAR | Status: DC | PRN
  Administered 2017-09-10 – 2017-09-11 (×6): 2 mg via INTRAVENOUS
  Administered 2017-09-11: 3 mg via INTRAVENOUS
  Administered 2017-09-11 (×2): 2 mg via INTRAVENOUS
  Administered 2017-09-11: 3 mg via INTRAVENOUS
  Administered 2017-09-11 – 2017-09-12 (×4): 2 mg via INTRAVENOUS
  Filled 2017-09-10: qty 1
  Filled 2017-09-10: qty 2
  Filled 2017-09-10 (×10): qty 1
  Filled 2017-09-10 (×2): qty 2

## 2017-09-10 MED ORDER — MORPHINE SULFATE (PF) 2 MG/ML IV SOLN
2.0000 mg | INTRAVENOUS | Status: DC | PRN
  Administered 2017-09-10: 2 mg via INTRAVENOUS
  Administered 2017-09-10 – 2017-09-11 (×6): 4 mg via INTRAVENOUS
  Administered 2017-09-11: 2 mg via INTRAVENOUS
  Administered 2017-09-11 – 2017-09-12 (×2): 4 mg via INTRAVENOUS
  Filled 2017-09-10: qty 2
  Filled 2017-09-10: qty 1
  Filled 2017-09-10 (×5): qty 2
  Filled 2017-09-10: qty 1
  Filled 2017-09-10 (×3): qty 2

## 2017-09-10 MED ORDER — FOLIC ACID 1 MG PO TABS
1.0000 mg | ORAL_TABLET | Freq: Every day | ORAL | Status: DC
  Administered 2017-09-10 – 2017-09-11 (×2): 1 mg via ORAL
  Filled 2017-09-10 (×2): qty 1

## 2017-09-10 MED ORDER — VITAMIN B-1 100 MG PO TABS
100.0000 mg | ORAL_TABLET | Freq: Every day | ORAL | Status: DC
  Administered 2017-09-10 – 2017-09-11 (×2): 100 mg via ORAL
  Filled 2017-09-10 (×2): qty 1

## 2017-09-10 MED ORDER — ADULT MULTIVITAMIN W/MINERALS CH
1.0000 | ORAL_TABLET | Freq: Every day | ORAL | Status: DC
  Administered 2017-09-10 – 2017-09-15 (×5): 1 via ORAL
  Filled 2017-09-10 (×5): qty 1

## 2017-09-10 NOTE — Progress Notes (Signed)
EEG Completed; Results Pending  
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
## 2017-09-10 NOTE — Procedures (Signed)
ELECTROENCEPHALOGRAM REPORT  Date of Study: 09/10/2017  Patient's Name: Claire Martinez MRN: [id number] Date of Birth: [birth year]  Referring Provider: Dr. Lynden OxfordPranav Patel  Clinical History: This is a 32 year old woman with seizure  Medications: Tegretol Clonazepam  Technical Summary: A multichannel digital EEG recording measured by the international 10-20 system with electrodes applied with paste and impedances below 5000 ohms performed in our laboratory with EKG monitoring in an awake and asleep patient.  Hyperventilation and photic stimulation were not performed.  The digital EEG was referentially recorded, reformatted, and digitally filtered in a variety of bipolar and referential montages for optimal display.    Description: The patient is awake and asleep during the recording.  During maximal wakefulness, there is a symmetric, medium voltage 10-11 Hz posterior dominant rhythm that attenuates with eye opening.  The record is symmetric.  There is an excess amount of diffuse low voltage beta activity seen throughout the recording. During drowsiness and sleep, there is an increase in theta slowing of the background. Vertex waves and symmetric sleep spindles were seen.  Hyperventilation and photic stimulation were not performed.  There were no epileptiform discharges or electrographic seizures seen.    EKG lead was unremarkable.  Impression: This awake and asleep EEG is normal except for excess amount of diffuse low voltage beta activity.  Clinical Correlation: Diffuse low voltage beta activity is commonly seen with sedating medications such as benzodiazepines.  In the absence of sedating medications, anxiety and hyperthyroidism may produce generalized beta activity.  The absence of epileptiform discharges does not exclude a clinical diagnosis of epilepsy. Clinical correlation is advised.   Claire Martinez, M.D.
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
## 2017-09-11 ENCOUNTER — Encounter (HOSPITAL_COMMUNITY): Payer: Self-pay | Admitting: Neurology

## 2017-09-11 ENCOUNTER — Inpatient Hospital Stay (HOSPITAL_COMMUNITY): Payer: BLUE CROSS/BLUE SHIELD

## 2017-09-11 DIAGNOSIS — R569 Unspecified convulsions: Secondary | ICD-10-CM

## 2017-09-11 DIAGNOSIS — G40909 Epilepsy, unspecified, not intractable, without status epilepticus: Secondary | ICD-10-CM

## 2017-09-11 LAB — CBC
HCT: 40.6 % (ref 36.0–46.0)
Hemoglobin: 13.6 g/dL (ref 12.0–15.0)
MCH: 33.4 pg (ref 26.0–34.0)
MCHC: 33.5 g/dL (ref 30.0–36.0)
MCV: 99.8 fL (ref 78.0–100.0)
Platelets: 179 10*3/uL (ref 150–400)
RBC: 4.07 MIL/uL (ref 3.87–5.11)
RDW: 12.6 % (ref 11.5–15.5)
WBC: 6.4 10*3/uL (ref 4.0–10.5)

## 2017-09-11 LAB — LACTIC ACID, PLASMA
LACTIC ACID, VENOUS: 1.9 mmol/L (ref 0.5–1.9)
Lactic Acid, Venous: 1 mmol/L (ref 0.5–1.9)

## 2017-09-11 LAB — MAGNESIUM
MAGNESIUM: 1.8 mg/dL (ref 1.7–2.4)
Magnesium: 2.9 mg/dL — ABNORMAL HIGH (ref 1.7–2.4)

## 2017-09-11 LAB — BASIC METABOLIC PANEL
ANION GAP: 7 (ref 5–15)
BUN: 9 mg/dL (ref 6–20)
CHLORIDE: 105 mmol/L (ref 98–111)
CO2: 30 mmol/L (ref 22–32)
Calcium: 8.9 mg/dL (ref 8.9–10.3)
Creatinine, Ser: 0.73 mg/dL (ref 0.44–1.00)
GFR calc non Af Amer: >60 mL/min (ref >60)
Glucose, Bld: 101 mg/dL — ABNORMAL HIGH (ref 70–99)
Potassium: 4.4 mmol/L (ref 3.5–5.1)
Sodium: 142 mmol/L (ref 135–145)

## 2017-09-11 MED ORDER — THIAMINE HCL 100 MG/ML IJ SOLN
500.0000 mg | Freq: Three times a day (TID) | INTRAVENOUS | Status: DC
  Administered 2017-09-11 – 2017-09-12 (×5): 500 mg via INTRAVENOUS
  Filled 2017-09-11 (×7): qty 5

## 2017-09-11 MED ORDER — NICOTINE 21 MG/24HR TD PT24
21.0000 mg | MEDICATED_PATCH | Freq: Every day | TRANSDERMAL | Status: DC
  Administered 2017-09-11 – 2017-09-13 (×3): 21 mg via TRANSDERMAL
  Filled 2017-09-11 (×3): qty 1

## 2017-09-11 MED ORDER — LEVETIRACETAM IN NACL 1000 MG/100ML IV SOLN
1000.0000 mg | Freq: Once | INTRAVENOUS | Status: AC
  Administered 2017-09-11: 1000 mg via INTRAVENOUS
  Filled 2017-09-11: qty 100

## 2017-09-11 MED ORDER — THIAMINE HCL 100 MG/ML IJ SOLN: 100.0000 mg | Freq: Every day | INTRAMUSCULAR | Status: DC

## 2017-09-11 MED ORDER — LEVETIRACETAM IN NACL 500 MG/100ML IV SOLN
500.0000 mg | Freq: Two times a day (BID) | INTRAVENOUS | Status: DC
  Administered 2017-09-11 – 2017-09-12 (×2): 500 mg via INTRAVENOUS
  Filled 2017-09-11 (×2): qty 100

## 2017-09-11 MED ORDER — MAGNESIUM SULFATE 2 GM/50ML IV SOLN
2.0000 g | Freq: Once | INTRAVENOUS | Status: AC
  Administered 2017-09-11: 2 g via INTRAVENOUS
  Filled 2017-09-11: qty 50

## 2017-09-11 MED ORDER — THIAMINE HCL 100 MG/ML IJ SOLN
500.0000 mg | Freq: Three times a day (TID) | INTRAMUSCULAR | Status: DC
  Filled 2017-09-11 (×2): qty 5

## 2017-09-11 MED ORDER — NICOTINE 21 MG/24HR TD PT24: 21.0000 mg | MEDICATED_PATCH | Freq: Every day | TRANSDERMAL | Status: DC
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
## 2017-09-11 NOTE — Progress Notes (Signed)
Found pt. On bedside commode acting erratically, screaming, and crying. Pt. Was transferred to the chair. Immediately once sitting in chair pt. Starting convulsing. The seizure lasted about 4-5 minutes. Pt. Came out of the seizure and was responding minimally and then about 1 minute later started convulsing again. The 2nd seizure lasted about 4-5 minutes again. Pt. Had incontinence of urine during the seizure. Pt. Was moved back to the bed shortly after the seizure was over MD was paged to the bedside during the seizure. Keppra was ordered and given to the patient. Pt. Received 3mg  of ativan during the seizure. MRI head was ordered. Pt. In MRI at this time.

## 2017-09-11 NOTE — Progress Notes (Addendum)
Pt has been extremely restless, tearful, and displays erratic/ unstable behavior.  Pt  has been pulling on leads and IV lines, despite staff redirection.  Pt stating she continues to have hallucinations & visual/ auditory  disturbances. Pt has required multiple doses of Ativan overnight. RN will continue to monitor.

## 2017-09-11 NOTE — Consult Note (Addendum)
NEURO HOSPITALIST CONSULT NOTE   Requestig physician: Dr. Janee Mornhompson   Reason for Consult: Seizure in the setting of alcohol withdrawal and benzodiazepine withdrawal  6 days out   History obtained from:  Patient   Chart    HPI:                                                                                                                                          Claire Martinez is an 32 y.o. female with medical history of PTSD, alcoholism-drinks approximately 24 beers a day for at least the past year.  Patient stopped drinking due to the fact she is gone through separation and wants custody of her children, per chart 3 days prior to hospitalization .  Per chart patient was found to have tonic[facility]clonic seizures at [facility].  Patient was then admitted to ICU and placed on CIWA protocol.  Patient also took 1 mg Klonopin daily and was placed back on 1 mg daily.  She was also placed on a milligrams of Librium to be taken 4 times a day, along with Tegretol 200 mg twice a day.  These are her home medications.  During the initial days of her hospitalization patient was very agitated and had hallucinations.  She was charting up to an 16 on the CIWA scale.  Today she was even charted at an 4118 on the CIWA scale.  She has been placed on thiamine 100 mg a day.  EEG was obtained yesterday which did not show any epileptiform activity.  Today however primary team was called because patient had 2 seizures lasting approximately 4 minutes.  They were described as tonic-clonic and patient received 3 mg Ativan IV.  Currently patient is down getting an MRI.  She is alert but confused she does not know that she is at the hospital, however she does know the month and the president.  She is somewhat lethargic secondary to the Ativan however she does have sympathetic signs such as dilated pupils and diaphoresis.  Past Medical History:  Diagnosis Date  . Agoraphobia   . Anxiety   . PTSD  (post-traumatic stress disorder)     History reviewed. No pertinent surgical history.  Family History  Problem Relation Age of Onset  . Hypertension Mother   . Hypertension Father                Social History:  reports that she has been smoking.  She has never used smokeless tobacco. She reports that she drinks alcohol. She reports that she does not use drugs.  No Known Allergies  MEDICATIONS:  Prior to Admission:  Medications Prior to Admission  Medication Sig Dispense Refill Last Dose  . amphetamine-dextroamphetamine (ADDERALL) 20 MG tablet Take 20 mg by mouth daily.  0 Past Week at Unknown time  . carbamazepine (TEGRETOL) 200 MG tablet Take 200 mg by mouth 2 (two) times daily.   09/08/2017 at Unknown time  . chlordiazePOXIDE (LIBRIUM) 10 MG capsule Take 20 mg by mouth 4 (four) times daily.   09/08/2017 at Unknown time  . clonazePAM (KLONOPIN) 1 MG tablet Take 1 mg by mouth 2 (two) times daily.  0 Past Week at Unknown time  . PARoxetine (PAXIL) 20 MG tablet Take 20 mg by mouth daily.  0 09/08/2017 at Unknown time  . traZODone (DESYREL) 50 MG tablet Take 50 mg by mouth at bedtime as needed for sleep.   0 09/07/2017 at Unknown time   Scheduled: . carbamazepine  200 mg Oral BID  . clonazePAM  1 mg Oral BID  . enoxaparin (LOVENOX) injection  40 mg Subcutaneous Q24H  . folic acid  1 mg Oral Daily  . multivitamin with minerals  1 tablet Oral Daily  . nicotine  21 mg Transdermal Daily  . PARoxetine  20 mg Oral Daily  . thiamine  100 mg Oral Daily   Continuous: . dextrose 5 % and 0.9% NaCl 50 mL/hr at 09/11/17 1000     ROS:                                                                                                                                       History obtained from the patient  General ROS: negative for - chills, fatigue, fever, night sweats, weight gain  or weight loss Psychological ROS: Positive for - behavioral disorder, hallucinations,  Ophthalmic ROS: negative for - blurry vision, double vision, eye pain or loss of vision ENT ROS: negative for - epistaxis, nasal discharge, oral lesions, sore throat, tinnitus or vertigo Allergy and Immunology ROS: negative for - hives or itchy/watery eyes Hematological and Lymphatic ROS: negative for - bleeding problems, bruising or swollen lymph nodes Endocrine ROS: negative for - galactorrhea, hair pattern changes, polydipsia/polyuria or temperature intolerance Respiratory ROS: negative for - cough, hemoptysis, shortness of breath or wheezing Cardiovascular ROS: negative for - chest pain, dyspnea on exertion, edema or irregular heartbeat Gastrointestinal ROS: negative for - abdominal pain, diarrhea, hematemesis, nausea/vomiting or stool incontinence Genito-Urinary ROS: negative for - dysuria, hematuria, incontinence or urinary frequency/urgency Musculoskeletal ROS: negative for - joint swelling or muscular weakness Neurological ROS: as noted in HPI Dermatological ROS: negative for rash and skin lesion changes   Blood pressure (!) 148/88, pulse (!) 102, temperature (!) 97.4 F (36.3 C), temperature source Oral, resp. rate 14, height 5\' 4"  (1.626 m), weight 65.9 kg (145 lb 3.2 oz), SpO2 99 %.   General Examination:  Physical Exam  HEENT-  Normocephalic, no lesions, without obvious abnormality.  Normal external eye and conjunctiva.   Cardiovascular- S1-S2 audible, pulses palpable throughout   Lungs-no rhonchi or wheezing noted, no excessive working breathing.  Saturations within normal limits Abdomen- All 4 quadrants palpated and nontender Extremities- Warm, dry and intact Musculoskeletal-no joint tenderness, deformity or swelling Skin-warm and dry, no hyperpigmentation, vitiligo, or suspicious  lesions  Neurological Examination Mental Status: Alert, oriented to month and president but not hospital currently under the influence of Ativan, .  Speech fluent without evidence of aphasia.  Able to follow 3 step commands without difficulty. Cranial Nerves: II: ; Visual fields grossly normal,  III,IV, VI: ptosis not present, extra-ocular motions intact bilaterally pupils equal 4mm to 2mm , round, reactive to light and accommodation V,VII: smile symmetric, facial light touch sensation normal bilaterally VIII: hearing normal bilaterally IX,X: uvula rises symmetrically XI: bilateral shoulder shrug XII: midline tongue extension Motor: Right : Upper extremity   5/5    Left:     Upper extremity   5/5  Lower extremity   5/5     Lower extremity   5/5 Tone and bulk:normal tone throughout; no atrophy noted Sensory: Pinprick and light touch intact throughout, bilaterally Deep Tendon Reflexes: 2+ and symmetric throughout Plantars: Right: downgoing   Left: downgoing Cerebellar: normal finger-to-nose, and normal heel-to-shin test Gait: Not tested   Lab Results: Basic Metabolic Panel: Recent Labs  Lab 09/08/17 1151 09/09/17 0455 09/10/17 0322 09/11/17 0811 09/11/17 1459  NA 138 139 139 142  --   K 3.9 3.7 3.8 4.4  --   CL 106 106 104 105  --   CO2 26 31 30 30   --   GLUCOSE 90 100* 89 101*  --   BUN 16 12 12 9   --   CREATININE 0.63 0.67 0.68 0.73  --   CALCIUM 8.6* 8.1* 8.2* 8.9  --   MG  --   --  1.8 1.8 2.9*  PHOS  --   --  3.6  --   --     CBC: Recent Labs  Lab 09/08/17 1151 09/09/17 0455 09/10/17 0322 09/11/17 0811  WBC 7.2 6.0 6.2 6.4  NEUTROABS 5.6  --  3.7  --   HGB 12.7 12.4 13.0 13.6  HCT 38.8 37.4 38.6 40.6  MCV 99.2 100.5* 99.0 99.8  PLT 192 176 202 179    Cardiac Enzymes: No results for input(s): CKTOTAL, CKMB, CKMBINDEX, TROPONINI in the last 168 hours.  Lipid Panel: No results for input(s): CHOL, TRIG, HDL, CHOLHDL, VLDL, LDLCALC in the last 168  hours.  Imaging: No results found.  Assessment and plan per attending neurologist  Felicie Morn PA-C [facility] [phone]  09/11/2017, 3:59 PM I have seen the patient reviewed the note.  Of note, she reports memory of bilateral arm shaking during her seizure.  She was incontinent of urine, but also described as eyes rolling back into her head, back arching, which are possibly suspicious for nonepileptic etiology.  Assessment: 32 year old female with recurrent seizures in the setting of benzo and alcohol withdrawal.  Even though we are slightly for out for alcohol withdrawal, I do think that this is still possibly the etiology of her seizures.  There are some components of the description of her events which have a concern for possible nonepileptic etiologies and therefore if these continue to be a problem, she may need to be transferred for continuous EEG monitoring.  At this point, I  do not have a high suspicion for an underlying epileptic tendency and therefore I do not think antiepileptic such as Keppra are indicated.  Recommendations: -Remain on CIWA scale - Initiated high-dose thiamine 500 mg 3 times daily for 3 days and then go back down to 100 mg daily -Seizure precautions --She continues to have seizures then we can transfer to [facility] for continuous EEG  Ritta Slot, MD [facility] [phone]  If 7pm- 7am, please page neurology on call as listed in AMION.

## 2017-09-11 NOTE — Progress Notes (Signed)
   09/11/17 1430  Clinical Encounter Type  Visited With Health care provider  Visit Type Follow-up;Psychological support;Spiritual support  Referral From Patient  Consult/Referral To Chaplain   Responded to a request by the patient for a chaplain to come see her as she needed someone to talk to.  Upon arrival the nurses were with her and now was not the appropriate time.  Will follow and support as needed. Chaplain Agustin CreeNewton Kenlee Maler

## 2017-09-11 NOTE — Progress Notes (Signed)
Was called by nursing as patient had 2 seizures noted.  Per RN patient was seen at the bedside commode and acting erratically and screaming.  Patient was subsequently transferred to the chair where he was noted that patient had 2 seizures with upper extremity jerking and lower extremity jerking with minimal responsiveness.  Per RN in between seizures people patient was able to respond somewhat.  Patient also noted to have some urinary incontinence.  No bowel incontinence.  No tongue biting noted.  Patient received 3 mg of IV Ativan and currently minimally responsive.  Patient protecting airway. General exam: Sedated. Respirations: Lungs clear to auscultation bilaterally no wheezes, no crackles, no rhonchi. Cardiovascular: Regular rate rhythm GI: Abdomen is soft, nontender, nondistended, positive bowel sounds. Extremities: No clubbing cyanosis or edema.  Assessment/plan 1.  Seizures versus pseudoseizures Patient now with seizures again today 09/11/2017.  Patient had seizures yesterday 09/10/2017.  EEG done did not show any epileptiform discharges.  Patient initially on admission was felt to have alcohol withdrawal seizures however should not be occurring at this time as patient on the Ativan withdrawal protocol and significant time is elapsed since her last drink.  CT head was unremarkable.  Check MRI head.  Check a lactic acid level.  Patient given IV magnesium this morning we will aim to keep magnesium greater than 2.  Will load with Keppra 1 g IV x1.  Case discussed with neurology, Dr. Amada JupiterKirkpatrick who recommended Keppra load.  Patient will be seen in formal consultation by neurology.  Continue seizure precautions.  Follow.

## 2017-09-12 ENCOUNTER — Inpatient Hospital Stay (HOSPITAL_COMMUNITY): Payer: BLUE CROSS/BLUE SHIELD

## 2017-09-12 DIAGNOSIS — F10231 Alcohol dependence with withdrawal delirium: Secondary | ICD-10-CM

## 2017-09-12 LAB — MAGNESIUM: MAGNESIUM: 1.7 mg/dL (ref 1.7–2.4)

## 2017-09-12 LAB — BASIC METABOLIC PANEL
ANION GAP: 11 (ref 5–15)
BUN: 12 mg/dL (ref 6–20)
CALCIUM: 8.5 mg/dL — AB (ref 8.9–10.3)
CO2: 23 mmol/L (ref 22–32)
Chloride: 103 mmol/L (ref 98–111)
Creatinine, Ser: 0.79 mg/dL (ref 0.44–1.00)
GFR calc Af Amer: >60 mL/min (ref >60)
Glucose, Bld: 93 mg/dL (ref 70–99)
POTASSIUM: 4 mmol/L (ref 3.5–5.1)
SODIUM: 137 mmol/L (ref 135–145)

## 2017-09-12 LAB — CBC
HCT: 39.3 % (ref 36.0–46.0)
Hemoglobin: 13.1 g/dL (ref 12.0–15.0)
MCH: 33.2 pg (ref 26.0–34.0)
MCHC: 33.3 g/dL (ref 30.0–36.0)
MCV: 99.5 fL (ref 78.0–100.0)
PLATELETS: 208 10*3/uL (ref 150–400)
RBC: 3.95 MIL/uL (ref 3.87–5.11)
RDW: 12.4 % (ref 11.5–15.5)
WBC: 7.3 10*3/uL (ref 4.0–10.5)

## 2017-09-12 MED ORDER — MAGNESIUM SULFATE 2 GM/50ML IV SOLN
2.0000 g | Freq: Once | INTRAVENOUS | Status: AC
  Administered 2017-09-12: 2 g via INTRAVENOUS
  Filled 2017-09-12: qty 50

## 2017-09-12 MED ORDER — FOLIC ACID 5 MG/ML IJ SOLN
1.0000 mg | Freq: Every day | INTRAMUSCULAR | Status: DC
  Administered 2017-09-12: 1 mg via INTRAVENOUS
  Filled 2017-09-12 (×2): qty 0.2

## 2017-09-12 MED ORDER — DEXMEDETOMIDINE HCL IN NACL 200 MCG/50ML IV SOLN
0.4000 ug/kg/h | INTRAVENOUS | Status: DC
  Administered 2017-09-12: 0.4 ug/kg/h via INTRAVENOUS

## 2017-09-12 MED ORDER — FENTANYL CITRATE (PF) 100 MCG/2ML IJ SOLN
25.0000 ug | INTRAMUSCULAR | Status: DC | PRN
Start: 2017-09-12 — End: 2017-09-13
  Administered 2017-09-12 – 2017-09-13 (×3): 50 ug via INTRAVENOUS
  Filled 2017-09-12 (×3): qty 2

## 2017-09-12 MED ORDER — HYDROCODONE-ACETAMINOPHEN 5-325 MG PO TABS
1.0000 | ORAL_TABLET | ORAL | Status: DC | PRN
  Administered 2017-09-13 – 2017-09-15 (×9): 1 via ORAL
  Filled 2017-09-12 (×10): qty 1

## 2017-09-12 MED ORDER — DEXMEDETOMIDINE HCL IN NACL 200 MCG/50ML IV SOLN
0.4000 ug/kg/h | INTRAVENOUS | Status: DC
  Administered 2017-09-12: 0.4 ug/kg/h via INTRAVENOUS
  Administered 2017-09-12: 0.5 ug/kg/h via INTRAVENOUS

## 2017-09-12 MED ORDER — DEXMEDETOMIDINE HCL IN NACL 400 MCG/100ML IV SOLN
0.4000 ug/kg/h | INTRAVENOUS | Status: DC
  Administered 2017-09-12: 0.6 ug/kg/h via INTRAVENOUS
  Filled 2017-09-12: qty 100

## 2017-09-12 MED ORDER — FENTANYL CITRATE (PF) 100 MCG/2ML IJ SOLN
25.0000 ug | INTRAMUSCULAR | Status: DC | PRN
  Administered 2017-09-12: 50 ug via INTRAVENOUS
  Administered 2017-09-12: 25 ug via INTRAVENOUS
  Filled 2017-09-12 (×2): qty 2

## 2017-09-12 MED ORDER — SODIUM CHLORIDE 0.9 % IV SOLN
INTRAVENOUS | Status: DC
  Administered 2017-09-12: 11:00:00 via INTRAVENOUS

## 2017-09-12 MED ORDER — LORAZEPAM 2 MG/ML IJ SOLN: 2.0000 mg | INTRAMUSCULAR | Status: DC | PRN

## 2017-09-12 MED ORDER — DEXMEDETOMIDINE HCL IN NACL 400 MCG/100ML IV SOLN
INTRAVENOUS | Status: AC
  Filled 2017-09-12: qty 100

## 2017-09-12 NOTE — Progress Notes (Signed)
vLTM EEG running. Notified Neuro/ no skin breakdown. educated Nurse Tech of event button

## 2017-09-12 NOTE — Progress Notes (Signed)
Pt. Was taking bed bath and alerted nursing staff that she was finished. Upon entering pt room; pt verbalizes " I feel like I am about to have a seizure". Charge RN stayed with patient while second RN went to get Ativan. Pt began convulsing with neck jerking backwards and upper and lower extremities were stiff. RN returned and administered Ativan 2mg . Pt seizure lasted 5 minutes. Pt seizure began to subside after drug administration and pt was minimally responsive post-seizure. E-link was notified and  MD videoed in added orders to increase Keppra and [facility]. Shortly afterwards, Pt began to act extremely erratic; wrapping monitor cords around neck and saying she was going to die. MD ordered precedex drip to help pt calm down. RN initiated precedex drip. Within 30 min pt was was settled and resting well.
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
## 2017-09-12 NOTE — Progress Notes (Signed)
eLink Physician-Brief Progress Note Patient Name: Apollonia Scheeler DOB: [birth year] MRN: [id number]   Date of Service  09/12/2017  HPI/Events of Note  Recurrent seizure while on Keppra and receiving prn Ativan per CIWA protocol. Active ETOH withdrawal with extreme agitation and risk of self injury.  eICU Interventions  [facility] for cEEG. Dexmedotomidine infusion 1:1 sitter in the room for patient safety.        Thomasene Lotkoronkwo U Jizel Cheeks 09/12/2017, 3:32 AM

## 2017-09-13 DIAGNOSIS — G40909 Epilepsy, unspecified, not intractable, without status epilepticus: Secondary | ICD-10-CM

## 2017-09-13 LAB — CBC
HCT: 39.1 % (ref 36.0–46.0)
Hemoglobin: 12.6 g/dL (ref 12.0–15.0)
MCH: 32.6 pg (ref 26.0–34.0)
MCHC: 32.2 g/dL (ref 30.0–36.0)
MCV: 101.3 fL — ABNORMAL HIGH (ref 78.0–100.0)
PLATELETS: 178 10*3/uL (ref 150–400)
RBC: 3.86 MIL/uL — ABNORMAL LOW (ref 3.87–5.11)
RDW: 12.2 % (ref 11.5–15.5)
WBC: 7.2 10*3/uL (ref 4.0–10.5)

## 2017-09-13 LAB — BASIC METABOLIC PANEL
Anion gap: 5 (ref 5–15)
BUN: 6 mg/dL (ref 6–20)
CHLORIDE: 104 mmol/L (ref 98–111)
CO2: 30 mmol/L (ref 22–32)
CREATININE: 0.74 mg/dL (ref 0.44–1.00)
Calcium: 8.6 mg/dL — ABNORMAL LOW (ref 8.9–10.3)
Glucose, Bld: 85 mg/dL (ref 70–99)
Potassium: 4.2 mmol/L (ref 3.5–5.1)
SODIUM: 139 mmol/L (ref 135–145)

## 2017-09-13 LAB — MAGNESIUM: Magnesium: 1.8 mg/dL (ref 1.7–2.4)

## 2017-09-13 LAB — PHOSPHORUS: PHOSPHORUS: 3.6 mg/dL (ref 2.5–4.6)

## 2017-09-13 MED ORDER — FOLIC ACID 1 MG PO TABS
1.0000 mg | ORAL_TABLET | Freq: Every day | ORAL | Status: DC
  Administered 2017-09-13 – 2017-09-15 (×3): 1 mg via ORAL
  Filled 2017-09-13 (×3): qty 1

## 2017-09-13 MED ORDER — PAROXETINE HCL 20 MG PO TABS
20.0000 mg | ORAL_TABLET | Freq: Every day | ORAL | Status: DC
  Administered 2017-09-13 – 2017-09-14 (×2): 20 mg via ORAL
  Filled 2017-09-13 (×2): qty 1

## 2017-09-13 MED ORDER — LORAZEPAM 2 MG/ML IJ SOLN: 1.0000 mg | Freq: Four times a day (QID) | INTRAMUSCULAR | Status: DC | PRN

## 2017-09-13 MED ORDER — LORAZEPAM 1 MG PO TABS
1.0000 mg | ORAL_TABLET | Freq: Four times a day (QID) | ORAL | Status: DC | PRN
  Administered 2017-09-13 (×2): 1 mg via ORAL
  Filled 2017-09-13 (×2): qty 1

## 2017-09-13 MED ORDER — DOCUSATE SODIUM 100 MG PO CAPS
100.0000 mg | ORAL_CAPSULE | Freq: Two times a day (BID) | ORAL | Status: DC
  Administered 2017-09-13 – 2017-09-15 (×5): 100 mg via ORAL
  Filled 2017-09-13 (×5): qty 1

## 2017-09-13 MED ORDER — NICOTINE 7 MG/24HR TD PT24
7.0000 mg | MEDICATED_PATCH | Freq: Every day | TRANSDERMAL | Status: DC
  Administered 2017-09-14 – 2017-09-15 (×2): 7 mg via TRANSDERMAL
  Filled 2017-09-13 (×2): qty 1

## 2017-09-13 MED ORDER — POLYETHYLENE GLYCOL 3350 17 G PO PACK
17.0000 g | PACK | Freq: Every day | ORAL | Status: DC
  Administered 2017-09-13 – 2017-09-15 (×3): 17 g via ORAL
  Filled 2017-09-13 (×3): qty 1

## 2017-09-13 MED ORDER — ACETAMINOPHEN-CODEINE #3 300-30 MG PO TABS: 1.0000 | ORAL_TABLET | ORAL | Status: DC | PRN

## 2017-09-13 MED ORDER — VITAMIN B-1 100 MG PO TABS
100.0000 mg | ORAL_TABLET | Freq: Every day | ORAL | Status: DC
Start: 2017-09-13 — End: 2017-09-15
  Administered 2017-09-13 – 2017-09-15 (×3): 100 mg via ORAL
  Filled 2017-09-13 (×3): qty 1

## 2017-09-13 NOTE — Progress Notes (Signed)
Subjective: No further events  Exam: Vitals:   09/13/17 0800 09/13/17 0808  BP: 94/61 94/61  Pulse: (!) 56 (!) 59  Resp: (!) 22 16  Temp:  (!) 97.5 F (36.4 C)  SpO2: 96% 96%   Gen: In bed, NAD Resp: non-labored breathing, no acute distress Abd: soft, nt  Neuro: MS: Awake, alert, oriented CN: He was equal round reactive, extra ocular movements intact Motor: 5/5 throughout Sensory: Intact light touch  Pertinent Labs: CBC-elevated MCV  Impression: 32 year old female with recurrent seizures in the setting of benzo and alcohol withdrawal. Even though we were slightly far out for alcohol withdrawal, I do think that this is still possibly the etiology of her seizures.   There are some components of the description of her events which have a concern for possible nonepileptic etiology and therefore she was transferred for LTM monitoring.   Recommendations: 1) continue LTM overnight tonight 2) continue CIWA protocol 3) neurology will follow  Ritta SlotMcNeill Marget Outten, MD [facility] [phone]  If 7pm- 7am, please page neurology on call as listed in AMION.

## 2017-09-13 NOTE — Plan of Care (Signed)
Ms. Kathrynn DuckingRumfelt transferred to 3W from 4N today.  She is currently on continuous EEG monitoring and 1:1 suicide precautions.  The last noted seizure activity was 6.26.19.  The patient espouses pain in her neck, and asks for Fentanyl (discontinued).  We will continue to manage her pain with PO medications, antiemetics and ensure safety by maintaining a 1:1 sitter until evaluation by psychology.

## 2017-09-13 NOTE — Progress Notes (Signed)
vLTM EEG maint complete. Continue to monitor 
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
## 2017-09-13 NOTE — Procedures (Signed)
  Electroencephalogram report- LTM   Data acquisition: 10-20 electrode placement.  Additional T1, T2, and EKG electrodes; 26 channel digital referential acquisition reformatted to 18 channel/7 channel coronal bipolar     Spike detection: ON     Seizure detection: ON   Beginning time: 09/12/17 at 10 23 am  Ending time: 09/13/17 at 08 12 am   CPT: 95951 Day of study: day 1    This 21 hours of intensive EEG monitoring with simultaneous video monitoring was performed for this patient with spells of shaking as a part of ongoing series to capture events of interest and determine if these are seizures.    Medications: _as per EMR  There was no pushbutton activations events during this recording.  Automated spike detection program did not detect any spikes. Seizure detection program did not detect any seizures .   Waking background activities were marked by 11 cps posterior dominant symmetric synchronized alpha rhythm which tends to attenuate with eyes opening.  Patient had normal drowsiness and sleep architecture.  There was no epileptiform discharges clinical or subclinical seizures present.   Prominent beta frequencies were present during wakefulness drowsiness and sleep.  Clinical interpretation: This 21 hours of intensive EEG monitoring with simultaneous monitoring did not record any clinical or subclinical seizures.  Background activities were normal during wakefulness drowsiness and sleep throughout the recording.  Prominent beta frequencies throughout the recording could be related to certain medications including benzodiazepines, barbiturates and other psychotropic medication.  Clinical correlation is advised.

## 2017-09-14 DIAGNOSIS — F1010 Alcohol abuse, uncomplicated: Secondary | ICD-10-CM

## 2017-09-14 DIAGNOSIS — S12601D Unspecified nondisplaced fracture of seventh cervical vertebra, subsequent encounter for fracture with routine healing: Secondary | ICD-10-CM

## 2017-09-14 DIAGNOSIS — F4310 Post-traumatic stress disorder, unspecified: Secondary | ICD-10-CM

## 2017-09-14 LAB — BASIC METABOLIC PANEL
ANION GAP: 9 (ref 5–15)
BUN: 6 mg/dL (ref 6–20)
CHLORIDE: 101 mmol/L (ref 98–111)
CO2: 29 mmol/L (ref 22–32)
CREATININE: 0.62 mg/dL (ref 0.44–1.00)
Calcium: 9 mg/dL (ref 8.9–10.3)
GFR calc non Af Amer: >60 mL/min (ref >60)
Glucose, Bld: 88 mg/dL (ref 70–99)
Potassium: 3.7 mmol/L (ref 3.5–5.1)
Sodium: 139 mmol/L (ref 135–145)

## 2017-09-14 LAB — MAGNESIUM: Magnesium: 1.7 mg/dL (ref 1.7–2.4)

## 2017-09-14 LAB — PHOSPHORUS: PHOSPHORUS: 3.1 mg/dL (ref 2.5–4.6)

## 2017-09-14 MED ORDER — PAROXETINE HCL 30 MG PO TABS
30.0000 mg | ORAL_TABLET | Freq: Every day | ORAL | Status: DC
  Administered 2017-09-15: 30 mg via ORAL
  Filled 2017-09-14: qty 1

## 2017-09-14 NOTE — Procedures (Signed)
  Electroencephalogram report- LTM   Data acquisition: 10-20 electrode placement.  Additional T1, T2, and EKG electrodes; 26 channel digital referential acquisition reformatted to 18 channel/7 channel coronal bipolar     Spike detection: ON     Seizure detection: ON   Beginning time: 09/13/17 at 08 12  am  Ending time: 09/14/17 at 07 55  am   CPT: 95951 Day of study: day 2   This intensive EEG monitoring with simultaneous video monitoring was performed for this patient with spells of shaking as a part of ongoing series to capture events of interest and determine if these are seizures.    Medications: _as per EMR  Day 1 There was no pushbutton activations events during this recording.  Automated spike detection program did not detect any spikes. Seizure detection program did not detect any seizures .   Waking background activities were marked by 11 cps posterior dominant symmetric synchronized alpha rhythm which tends to attenuate with eyes opening.  Patient had normal drowsiness and sleep architecture.  There was no epileptiform discharges clinical or subclinical seizures present.   Prominent beta frequencies were present during wakefulness drowsiness and sleep.  Day 2 : There was no pushbutton activations events during this recording. Waking background activities were marked by 11 cps posterior dominant rhythm which tends to attenuate with eyes opening .  No spells There was no epileptiform discharges clinical or subclinical seizures present.   Prominent beta frequencies were present during wakefulness drowsiness and sleep.   Clinical interpretation: This day 2 of  intensive EEG monitoring with simultaneous monitoring did not record any clinical or subclinical seizures.  Background activities were normal during wakefulness drowsiness and sleep throughout the recording.  Prominent beta frequencies throughout the recording could be related to certain medications including  benzodiazepines, barbiturates and other psychotropic medication.  Clinical correlation is advised.
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
## 2017-09-14 NOTE — Progress Notes (Signed)
LTM EEG D/C'd per Dr Kirkpatrick 

## 2017-09-14 NOTE — Progress Notes (Signed)
Subjective: pt states she fells well today and that she thinks she is "over this", pointing to EEG. No further events  Exam: Vitals:   09/14/17 0014 09/14/17 0751  BP: 112/65 117/78  Pulse: (!) 53 (!) 59  Resp: 20 20  Temp: 98.6 F (37 C) 97.9 F (36.6 C)  SpO2: 98%    Gen: In bed, NAD Resp: non-labored breathing, no acute distress Abd: soft, nt  Neuro: MS: Awake, alert, oriented CN: He was equal round reactive, extra ocular movements intact Motor: 5/5 throughout Sensory: Intact light touch  Pertinent Labs: CBC-elevated MCV  Impression: 32 year old female with recurrent seizures in the setting of benzo and alcohol withdrawal. However, the reoccurrence is a bit far out from time line of withdrawal. There are some components of the description of her events which have a concern for possible nonepileptic etiology and therefore she was transferred for LTM monitoring. Since LTM has been started, there have been no further events.   Recommendations: 1) d/c EEG 2) continue CIWA protocol 3) continue sitter for safety 4) Psych consult  Eloyse Causey Metzger-Cihelka, ARNP-C [facility] [phone]  If 7pm- 7am, please page neurology on call as listed in AMION.
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
## 2017-09-15 DIAGNOSIS — G40909 Epilepsy, unspecified, not intractable, without status epilepticus: Secondary | ICD-10-CM

## 2017-09-15 DIAGNOSIS — F10239 Alcohol dependence with withdrawal, unspecified: Secondary | ICD-10-CM

## 2017-09-15 MED ORDER — POLYETHYLENE GLYCOL 3350 17 G PO PACK: 17.0000 g | PACK | Freq: Every day | ORAL | 0 refills | Status: AC

## 2017-09-15 MED ORDER — ADULT MULTIVITAMIN W/MINERALS CH: 1.0000 | ORAL_TABLET | Freq: Every day | ORAL | 1 refills | Status: AC

## 2017-09-15 MED ORDER — PAROXETINE HCL 30 MG PO TABS: 30.0000 mg | ORAL_TABLET | Freq: Every day | ORAL | 1 refills | Status: AC

## 2017-09-15 MED ORDER — FOLIC ACID 1 MG PO TABS: 1.0000 mg | ORAL_TABLET | Freq: Every day | ORAL | 1 refills | Status: AC

## 2017-09-15 MED ORDER — THIAMINE HCL 100 MG PO TABS: 100.0000 mg | ORAL_TABLET | Freq: Every day | ORAL | 0 refills | Status: AC

## 2017-09-15 NOTE — Plan of Care (Addendum)
It is likely that the patient will be discharged this afternoon.  Social work is evaluating rehab placement.  RN/MD/LCSW all agree that the patient wants to continue her course of rehab.  She has been calm, cooperative, appropriate and optimistic (although, understandably, anxious about her placement after discharge).    I spent time educating the patient on strategies to fight cravings, addictions and resiliency--no matter to where she discharges.    Addendum 15:14 Regarding discontinuation of suicide precautions yesterday, [facility] discontinued 1:1 sitter order.  I discontinued suicide sitter order.  The discontinuation of suicide precautions was oversight, which was communicated to overnight nurse.

## 2017-09-15 NOTE — Progress Notes (Addendum)
Patient was seen face to face today by Thedore MinsMojeed Akintayo for psychiatric evaluation in this evaluation he states that patient is no longer a risk to harm self or others so suicide precaution is no longer needed.   I sent an AMION to [facility] attending asking them to DC the suicide precaution order, I will also follow up with day nurse and find out why this was not done during day shift when, will continue to monitor.
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
## 2017-09-15 NOTE — Progress Notes (Signed)
CSW contacted by MD to assist with patient discharging back to substance abuse treatment center. Patient admitted from [facility], but was being told that she was not allowed to go back. CSW contacted Admissions at [facility], spoke with Natalia LeatherwoodKatherine about patient's situation. CSW also spoke with Sharia ReeveJosh, charge nurse on the unit, about situation.   Per report from [facility], patient had been medically discharged due to her seizure disorder being too complex for what they can handle at the facility. Per [facility] report, they had provided that information to patient's husband and had referred patient to [facility] in [place] for further treatment. CSW updated patient, received permission to contact patient's husband. Per patient's husband, [facility] never called to inform him that she had been discharged; they had called to say they weren't sure and her case was under review, but they were supposed to be calling him back and letting him know for sure.   CSW printed out information for patient about [facility]. CSW attempted to call and set an appointment for the patient; [facility] only has admissions officers available from 9 am to 5 pm, Monday through Friday. CSW completed research on other options for treatment, printed out information for [facility] [place], [place] as well as [facility]. CSW provided information to patient. Patient and husband both reported that they will call first thing tomorrow to find a place for the patient to go to continue her treatment. Patient reported that she is still interested in seeking treatment, she wants to be healthy for her kids.   CSW reported information to MD. CSW signing off.  Blenda NicelyElizabeth Mechell [place], [place] Clinical Social Worker [phone]
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
